# Patient Record
Sex: Male | Born: 1945 | Race: White | Hispanic: No | Marital: Married | Smoking: Former smoker
Health system: Southern US, Community
[De-identification: ages and names within clinical notes are randomized; demographics above are authoritative.]

## PROBLEM LIST (undated history)

## (undated) DIAGNOSIS — I10 Essential (primary) hypertension: Secondary | ICD-10-CM

## (undated) DIAGNOSIS — M1990 Unspecified osteoarthritis, unspecified site: Secondary | ICD-10-CM

## (undated) DIAGNOSIS — E039 Hypothyroidism, unspecified: Secondary | ICD-10-CM

## (undated) HISTORY — PX: CHOLECYSTECTOMY: SHX55

## (undated) HISTORY — PX: OTHER SURGICAL HISTORY: SHX169

## (undated) HISTORY — PX: LUMBAR FUSION: SHX111

## (undated) HISTORY — PX: TOTAL KNEE ARTHROPLASTY: SHX125

---

## 1998-03-10 ENCOUNTER — Ambulatory Visit (HOSPITAL_COMMUNITY): Admission: RE | Admit: 1998-03-10 | Discharge: 1998-03-10 | Payer: Self-pay | Admitting: Neurosurgery

## 1998-03-20 ENCOUNTER — Ambulatory Visit (HOSPITAL_COMMUNITY): Admission: RE | Admit: 1998-03-20 | Discharge: 1998-03-20 | Payer: Self-pay | Admitting: Neurosurgery

## 1998-04-10 ENCOUNTER — Inpatient Hospital Stay (HOSPITAL_COMMUNITY): Admission: RE | Admit: 1998-04-10 | Discharge: 1998-04-13 | Payer: Self-pay | Admitting: Neurosurgery

## 2007-10-03 ENCOUNTER — Inpatient Hospital Stay (HOSPITAL_COMMUNITY): Admission: RE | Admit: 2007-10-03 | Discharge: 2007-10-05 | Payer: Self-pay | Admitting: Orthopedic Surgery

## 2008-01-04 ENCOUNTER — Inpatient Hospital Stay (HOSPITAL_COMMUNITY): Admission: RE | Admit: 2008-01-04 | Discharge: 2008-01-06 | Payer: Self-pay | Admitting: Orthopedic Surgery

## 2009-08-06 IMAGING — CR DG KNEE 1-2V PORT*L*
2 series · 2 of 2 positions shown · non-contrast
Comparison: none

CLINICAL DATA: Postop left knee replacement.  
 PORTABLE LEFT KNEE - 2 VIEW:

[view not recorded (1 of 2)]
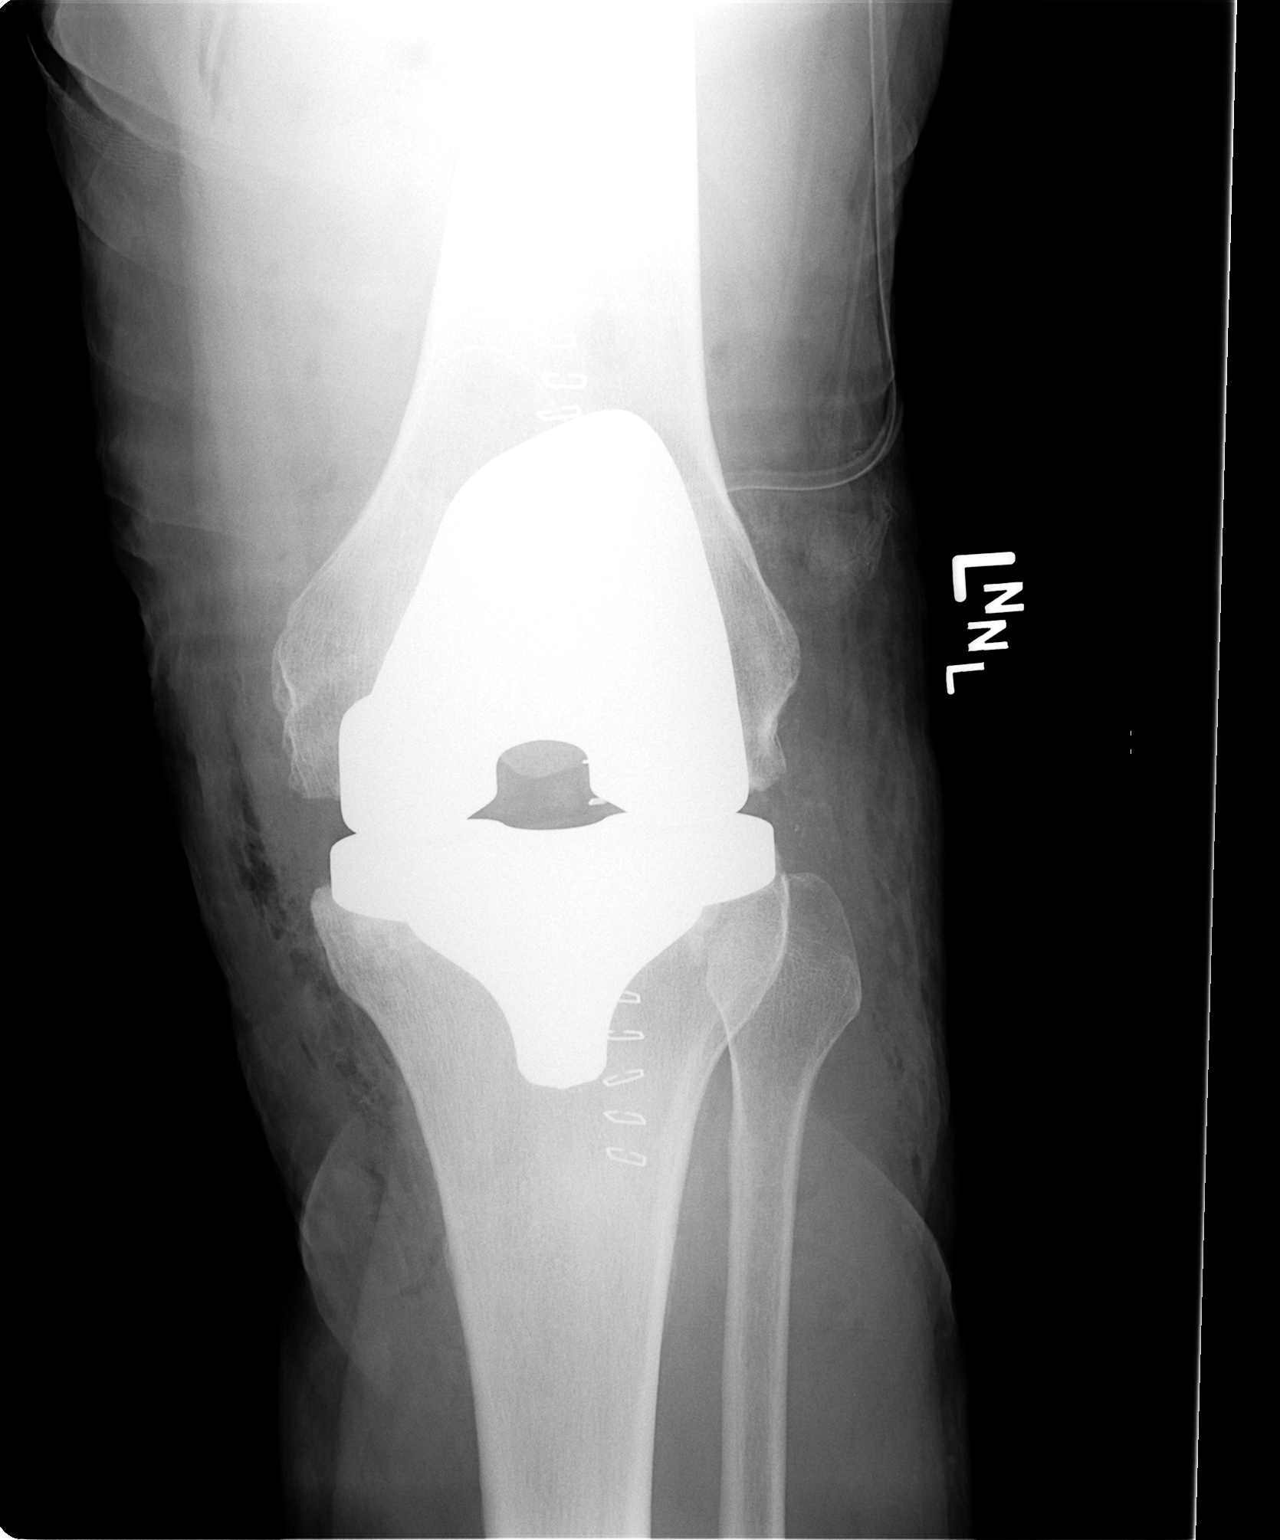

[view not recorded (2 of 2)]
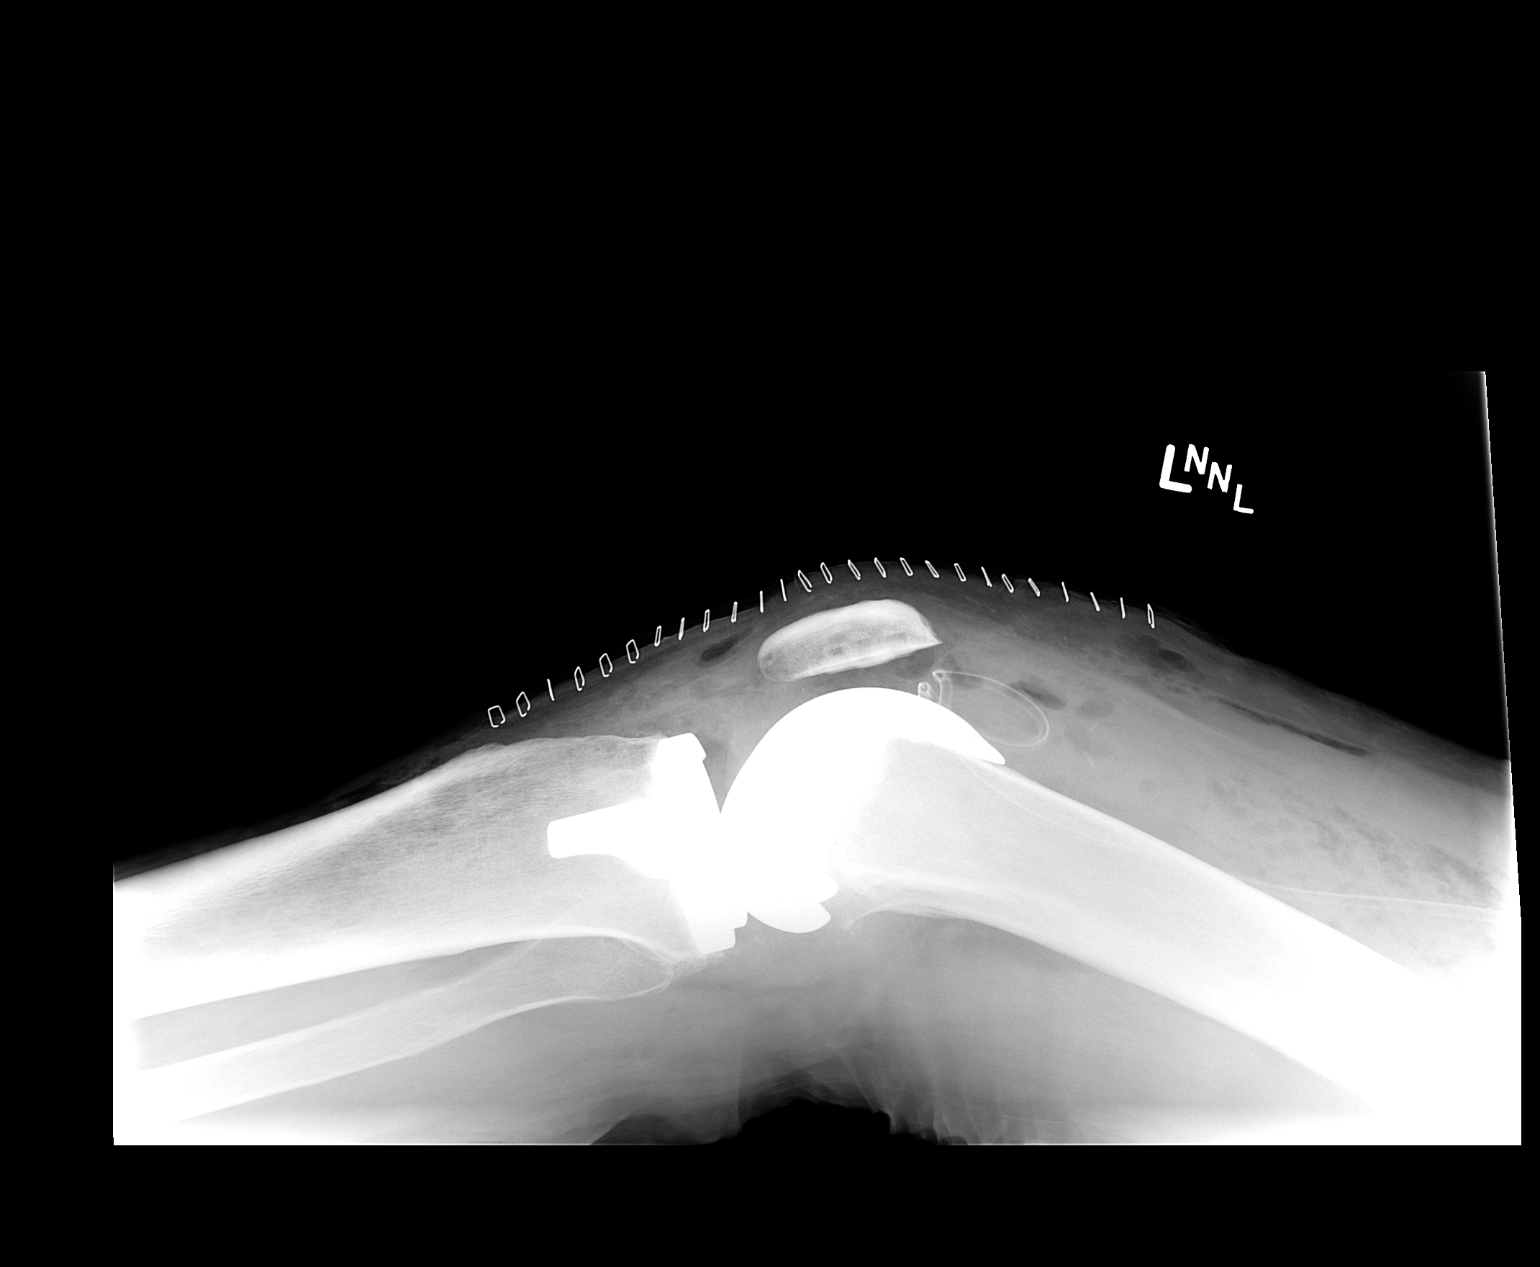

[2 of 2 positions shown; findings below may reference images not displayed]

FINDINGS: Left total knee arthroplasty.  Components appear well seated.  There is suprapatellar drain and skin staples.
IMPRESSION: Left total knee arthroplasty without evidence of complication.

## 2011-03-24 NOTE — Op Note (Signed)
NAME:  Jose Wade, Jose Wade NO.:  [id number]   MEDICAL RECORD NO.:  [id number]          PATIENT TYPE:  INP   LOCATION:  5025                         FACILITY:  [facility]   PHYSICIAN:  Loreta Ave, M.D. DATE OF BIRTH:  04-27-1946   DATE OF PROCEDURE:  01/04/2008  DATE OF DISCHARGE:                               OPERATIVE REPORT   PREOPERATIVE DIAGNOSES:  1. 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  2. 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   POSTOPERATIVE DIAGNOSES:  1. 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  2. 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   PROCEDURE:  1. Left knee total knee replacement Stryker triathlon prosthesis,      minimally invasive system.  Cemented #5 femoral component.      Cemented #6 tibial component with 9-mm polyethylene insert.      Resurfacing pegged medial offset cemented #38 patellar component.      Extensive medial capsular release for soft tissue balancing.  2. Exam manipulation of his right knee under anesthesia.   SURGEON:  Loreta Ave, M.D.   ASSISTANT:  Zonia Kief, P.A., present throughout the entire case and  necessary for timely completion of procedure.   ANESTHESIA:  General.   BLOOD LOSS:  Minimal.   SPECIMENS:  None.   CULTURES:  None.   COMPLICATIONS:  None.   DRESSING:  Soft compressive with knee immobilizer.   DRAIN:  Hemovac x1.   TOURNIQUET TIME:  1 hour 30 minutes.   PROCEDURE:  The patient brought to the operating room, placed on the  operating table in supine position.  After adequate anesthesia had been  obtained, the right knee was examined.  Just about full  extension/flexion to about 110 degrees.  This was gently manipulated,  breaking up suprapatellar adhesions, achieving full extension,  full  flexion and improved patellofemoral mobility.  He tolerated this well  without adverse occurrence.  When that was complete, attention was  turned to the opposite left knee.  Tourniquet applied.  Prepped and  draped in usual sterile fashion.  Exsanguinated with elevation of  Esmarch, tourniquet inflated to 350 mmHg.  Knee examined 7-10 degrees  flexion contracture, varus alignment, barely corrected in neutral.  Incision above the patella down to tibial tubercle.  Medial arthrotomy  going up to a vastus splitting incision preserving quad tendon for a  minimally invasive system.  Knee exposed.  Extensive exuberant  hypertrophic osteoarthritis.  Loose bodies, periarticular spurs,  remnants of menisci, cruciate ligaments excised.  Distal femur exposed.  Intramedullary guide placed.  Twelve mm.  Using epicondylar axis, the  femur was sized, cut and fitted for a pegged posterior stabilized #5  femoral component.  Trial fit well.  Attention was turned to the tibia.  Resection down just below the defect medially where he had erosion over  the posteromedial  aspect of the tibia.  Three degree posterior slope cut  with the extramedullary guide.  Size #6 component.  Trials put in place,  #6 on the tibia, #5 on the femur.  With a 9-mm polyethylene insert  trial, I had full extension, full flexion, nicely balanced knee after  his medial capsular release.  Tibia was marked for appropriate rotation  and then punched for the keel of the definitive component.  Patella was  exposed.  Periarticular spurs removed.  Posterior 11 mm resected.  Size  drilled and fitted for a 38-mm component.  With trials in place,  excellent patellofemoral tracking at completion.  All trials removed.  Copious irrigation with pulse irrigating device.  Cement prepared and  placed on all components which were firmly seated and the polyethylene  attached to tibia.  Knee reduced.  Once cement had hardened, knee was  reexamined.   Full extension, full flexion, nicely balanced knee with  good stability in flexion and extension and good patellofemoral  tracking.  Wound irrigated.  Hemovac placed and brought out through a  separate stab wound.  Arthrotomy closed with #1 Vicryl.  Skin and  subcutaneous tissue with Vicryl and staples.  Knee injected with  Marcaine.  Hemovac clamp.  Sterile compressive dressing applied.  Tourniquet deflated and removed.  Knee immobilizer applied.  Anesthesia  reversed.  Brought to the recovery room.  Tolerated the surgery well.  No complications.      Loreta Ave, M.D.  Electronically Signed     DFM/MEDQ  D:  01/04/2008  T:  01/05/2008  Job:  [id number]

## 2011-03-24 NOTE — Op Note (Signed)
NAME:  Jose, Wade NO.:  [id number]   MEDICAL RECORD NO.:  [id number]          PATIENT TYPE:  INP   LOCATION:  5040                         FACILITY:  [facility]   PHYSICIAN:  Loreta Ave, M.D. DATE OF BIRTH:  [birth year]   DATE OF PROCEDURE:  10/03/2007  DATE OF DISCHARGE:                               OPERATIVE REPORT   PREOPERATIVE DIAGNOSIS:  Endstage degenerative arthritis right knee,  varus alignment, flexion contracture.   POSTOPERATIVE DIAGNOSIS:  Endstage degenerative arthritis right knee,  varus alignment, flexion contracture.   PROCEDURE:  Right total knee replacement, Stryker triathlon prosthesis.  Soft tissue balancing.  Cemented pegged posterior stabilized #5 femoral  component.  Cemented #6 tibial component with 11 mm posterior stabilized  polyethylene insert.  Resurfacing cemented pegged 38 mm patellar  component.  Minimally invasive approach.   SURGEON:  Loreta Ave, M.D.   ASSISTANT:  Zonia Kief, PA present out the entire case.   ANESTHESIA:  General.   BLOOD LOSS:  Minimal.   TOURNIQUET TIME:  1 hour 30 minutes.   SPECIMENS:  None.   CULTURES:  None.   COMPLICATIONS:  None.   DRESSING:  Soft compressive, knee immobilizer.   DRAIN:  Hemovac x1.   PROCEDURE:  The patient was brought to the operating room and placed on  the operating table in the supine position.  After adequate anesthesia  had been obtained, right knee examined.  Varus alignment more than 5  degree flexion contracture further flexion better than 100 degrees.  Correctable to neutral.  Tourniquet applied.  Prepped and draped in the  usual sterile fashion.  Exsanguinated with elevation Esmarch tourniquet  inflated to 350 mmHg.  Straight incision above the patella down to  tibial tubercle.  Medial arthrotomy going up to the superomedial border  of patella and then splitting the vastus for minimally invasive approach  preserving quad tendon.  Knee exposed.   Grade 4 change throughout.  Medial capsule release.  Remnants of menisci, cruciate ligament,  numerous large loose bodies, periarticular spurs removed.  Distal femur  exposed.  Distal cut 12 mm set at 5 degrees of valgus.  Epicondylar axis  marked.  Cut, sized and fitted for a posterior stabilized #5 femoral  component.  This was a little narrow side-to-side, but it was a perfect  match front to back.  A 6 was too large front to back.  Attention turned  to the tibia.  Extramedullary guide.  Proximal cut 3 degrees posterior  slope cut going just below the medial defect.  Size #6 component.  Trial  put in place.  #5 on the femur, #6 on the tibia.  With the 11 mm insert  and a soft tissue balancing, full extension, full flexion, nicely  balanced knee with equal stability in flexion and extension.  Patella  was prepared, cut and fitted with a 38 mm cemented pegged component  medial offset.  Excellent patellofemoral tracking at completion.  All  trials removed.  Copious irrigation with a pulse irrigating device.  Cement prepared and placed on all components which were firmly  fitted.  Excessive cement removed.  Polyethylene attached to tibia and knee  reduced.  Once cement hardened, it was reexamined.  I was very pleased  with motion, stability and patellofemoral tracking.  Hemovac placed,  brought out through a separate stab wound.  Arthrotomy closed with #1  Vicryl, skin and subcutaneous tissue with Vicryl and staples.  Knee  injected with Marcaine.  Hemovac clamp.  Sterile compressive dressing  applied.  Tourniquet deflated and removed.  Knee immobilizer applied.  Anesthesia reversed.  Brought to the recovery room.  Tolerated surgery  well.  No complications.      Loreta Ave, M.D.  Electronically Signed     DFM/MEDQ  D:  10/04/2007  T:  10/04/2007  Job:  [id number]

## 2011-07-31 LAB — CBC
MCHC: 33.8
MCHC: 33.9
MCHC: 34
MCV: 85.6
Platelets: 158
Platelets: 232
RBC: 5.16
RDW: 15.1
RDW: 15.4
WBC: 5.4
WBC: 7.4
WBC: 8.2

## 2011-07-31 LAB — COMPREHENSIVE METABOLIC PANEL
Albumin: 4.3
Creatinine, Ser: 0.94
Glucose, Bld: 100 — ABNORMAL HIGH
Sodium: 141
Total Bilirubin: 0.9
Total Protein: 7.2

## 2011-07-31 LAB — URINALYSIS, ROUTINE W REFLEX MICROSCOPIC
Bilirubin Urine: NEGATIVE
Glucose, UA: NEGATIVE
Hgb urine dipstick: NEGATIVE
Ketones, ur: NEGATIVE
Specific Gravity, Urine: 1.008
pH: 6

## 2011-07-31 LAB — BASIC METABOLIC PANEL
BUN: 10
BUN: 18
CO2: 29
Calcium: 7.9 — ABNORMAL LOW
Chloride: 102
Creatinine, Ser: 1.04
GFR calc Af Amer: >60
GFR calc non Af Amer: >60
GFR calc non Af Amer: >60
Glucose, Bld: 143 — ABNORMAL HIGH

## 2011-07-31 LAB — APTT: aPTT: 34

## 2011-07-31 LAB — TYPE AND SCREEN: Antibody Screen: NEGATIVE

## 2011-07-31 LAB — PROTIME-INR
INR: 1.3
Prothrombin Time: 16.9 — ABNORMAL HIGH
Prothrombin Time: 21.4 — ABNORMAL HIGH

## 2011-08-18 LAB — PROTIME-INR
INR: 1
Prothrombin Time: 13.5

## 2011-08-18 LAB — BASIC METABOLIC PANEL
CO2: 26
CO2: 29
Calcium: 8.3 — ABNORMAL LOW
Chloride: 103
Creatinine, Ser: 1.08
Creatinine, Ser: 1.12
GFR calc Af Amer: >60
GFR calc Af Amer: >60
Potassium: 3.6
Sodium: 138

## 2011-08-18 LAB — COMPREHENSIVE METABOLIC PANEL
ALT: 22
AST: 27
Albumin: 4.1
Alkaline Phosphatase: 103
Glucose, Bld: 139 — ABNORMAL HIGH
Total Bilirubin: 1
Total Protein: 7.3

## 2011-08-18 LAB — URINALYSIS, ROUTINE W REFLEX MICROSCOPIC
Bilirubin Urine: NEGATIVE
Nitrite: NEGATIVE
Protein, ur: NEGATIVE
Urobilinogen, UA: 0.2
pH: 5

## 2011-08-18 LAB — CBC
HCT: 34.4 — ABNORMAL LOW
Hemoglobin: 11.8 — ABNORMAL LOW
Hemoglobin: 12 — ABNORMAL LOW
Hemoglobin: 15.2
MCHC: 34.3
MCHC: 34.5
MCHC: 34.8
MCV: 86.3
MCV: 86.7
MCV: 87.9
RBC: 3.89 — ABNORMAL LOW
RDW: 13.4
WBC: 6.6

## 2011-08-18 LAB — APTT: aPTT: 34

## 2011-08-18 LAB — TYPE AND SCREEN

## 2014-12-11 NOTE — Patient Instructions (Signed)
Your procedure is scheduled on: 12/17/2014  Report to [facility] at  800 AM.  Call this number if you have problems the morning of surgery: [phone]   Do not eat food or drink liquids :After Midnight.      Take these medicines the morning of surgery with A SIP OF WATER: lisinopril, metoprolol   Do not wear jewelry, make-up or nail polish.  Do not wear lotions, powders, or perfumes.   Do not shave 48 hours prior to surgery.  Do not bring valuables to the hospital.  Contacts, dentures or bridgework may not be worn into surgery.  Leave suitcase in the car. After surgery it may be brought to your room.  For patients admitted to the hospital, checkout time is 11:00 AM the day of discharge.   Patients discharged the day of surgery will not be allowed to drive home.  :     Please read over the following fact sheets that you were given: Coughing and Deep Breathing, Surgical Site Infection Prevention, Anesthesia Post-op Instructions and Care and Recovery After Surgery    Cataract A cataract is a clouding of the lens of the eye. When a lens becomes cloudy, vision is reduced based on the degree and nature of the clouding. Many cataracts reduce vision to some degree. Some cataracts make people more near-sighted as they develop. Other cataracts increase glare. Cataracts that are ignored and become worse can sometimes look white. The white color can be seen through the pupil. CAUSES   Aging. However, cataracts may occur at any age, even in newborns.   Certain drugs.   Trauma to the eye.   Certain diseases such as diabetes.   Specific eye diseases such as chronic inflammation inside the eye or a sudden attack of a rare form of glaucoma.   Inherited or acquired medical problems.  SYMPTOMS   Gradual, progressive drop in vision in the affected eye.   Severe, rapid visual loss. This most often happens when trauma is the cause.  DIAGNOSIS  To detect a cataract, an eye doctor examines the lens.  Cataracts are best diagnosed with an exam of the eyes with the pupils enlarged (dilated) by drops.  TREATMENT  For an early cataract, vision may improve by using different eyeglasses or stronger lighting. If that does not help your vision, surgery is the only effective treatment. A cataract needs to be surgically removed when vision loss interferes with your everyday activities, such as driving, reading, or watching TV. A cataract may also have to be removed if it prevents examination or treatment of another eye problem. Surgery removes the cloudy lens and usually replaces it with a substitute lens (intraocular lens, IOL).  At a time when both you and your doctor agree, the cataract will be surgically removed. If you have cataracts in both eyes, only one is usually removed at a time. This allows the operated eye to heal and be out of danger from any possible problems after surgery (such as infection or poor wound healing). In rare cases, a cataract may be doing damage to your eye. In these cases, your caregiver may advise surgical removal right away. The vast majority of people who have cataract surgery have better vision afterward. HOME CARE INSTRUCTIONS  If you are not planning surgery, you may be asked to do the following:  Use different eyeglasses.   Use stronger or brighter lighting.   Ask your eye doctor about reducing your medicine dose or changing medicines if  it is thought that a medicine caused your cataract. Changing medicines does not make the cataract go away on its own.   Become familiar with your surroundings. Poor vision can lead to injury. Avoid bumping into things on the affected side. You are at a higher risk for tripping or falling.   Exercise extreme care when driving or operating machinery.   Wear sunglasses if you are sensitive to bright light or experiencing problems with glare.  SEEK IMMEDIATE MEDICAL CARE IF:   You have a worsening or sudden vision loss.   You notice  redness, swelling, or increasing pain in the eye.   You have a fever.  Document Released: 10/26/2005 Document Revised: 10/15/2011 Document Reviewed: 06/19/2011 [facility] Patient Information 2012 [place].PATIENT INSTRUCTIONS POST-ANESTHESIA  IMMEDIATELY FOLLOWING SURGERY:  Do not drive or operate machinery for the first twenty four hours after surgery.  Do not make any important decisions for twenty four hours after surgery or while taking narcotic pain medications or sedatives.  If you develop intractable nausea and vomiting or a severe headache please notify your doctor immediately.  FOLLOW-UP:  Please make an appointment with your surgeon as instructed. You do not need to follow up with anesthesia unless specifically instructed to do so.  WOUND CARE INSTRUCTIONS (if applicable):  Keep a dry clean dressing on the anesthesia/puncture wound site if there is drainage.  Once the wound has quit draining you may leave it open to air.  Generally you should leave the bandage intact for twenty four hours unless there is drainage.  If the epidural site drains for more than 36-48 hours please call the anesthesia department.  QUESTIONS?:  Please feel free to call your physician or the hospital operator if you have any questions, and they will be happy to assist you.

## 2014-12-12 ENCOUNTER — Encounter (HOSPITAL_COMMUNITY)
Admission: RE | Admit: 2014-12-12 | Discharge: 2014-12-12 | Disposition: A | Discharge status: Home / Self Care | Payer: Medicare Other | Source: Ambulatory Visit | Attending: Ophthalmology | Admitting: Ophthalmology

## 2014-12-12 ENCOUNTER — Encounter (HOSPITAL_COMMUNITY): Payer: Self-pay

## 2014-12-12 DIAGNOSIS — H2511 Age-related nuclear cataract, right eye: Secondary | ICD-10-CM | POA: Diagnosis not present

## 2014-12-12 DIAGNOSIS — Z01818 Encounter for other preprocedural examination: Secondary | ICD-10-CM | POA: Insufficient documentation

## 2014-12-12 HISTORY — DX: Essential (primary) hypertension: I10

## 2014-12-12 HISTORY — DX: Unspecified osteoarthritis, unspecified site: M19.90

## 2014-12-12 LAB — CBC
HCT: 42.1 % (ref 39.0–52.0)
Hemoglobin: 14.2 g/dL (ref 13.0–17.0)
MCH: 29.8 pg (ref 26.0–34.0)
MCHC: 33.7 g/dL (ref 30.0–36.0)
MCV: 88.3 fL (ref 78.0–100.0)
Platelets: 183 10*3/uL (ref 150–400)
RBC: 4.77 MIL/uL (ref 4.22–5.81)
RDW: 13.1 % (ref 11.5–15.5)
WBC: 5.2 10*3/uL (ref 4.0–10.5)

## 2014-12-12 LAB — BASIC METABOLIC PANEL
ANION GAP: 3 — AB (ref 5–15)
BUN: 19 mg/dL (ref 6–23)
CALCIUM: 8.6 mg/dL (ref 8.4–10.5)
CO2: 28 mmol/L (ref 19–32)
Chloride: 107 mmol/L (ref 96–112)
Creatinine, Ser: 1 mg/dL (ref 0.50–1.35)
GFR calc Af Amer: 86 mL/min — ABNORMAL LOW (ref >90)
GFR, EST NON AFRICAN AMERICAN: 74 mL/min — AB (ref >90)
GLUCOSE: 144 mg/dL — AB (ref 70–99)
Potassium: 4.2 mmol/L (ref 3.5–5.1)
Sodium: 138 mmol/L (ref 135–145)

## 2014-12-12 NOTE — Pre-Procedure Instructions (Signed)
Patient given information top sign up for my chart at home. 

## 2014-12-14 MED ORDER — LIDOCAINE HCL 3.5 % OP GEL
OPHTHALMIC | Status: AC
  Filled 2014-12-14: qty 1

## 2014-12-14 MED ORDER — CYCLOPENTOLATE-PHENYLEPHRINE OP SOLN OPTIME - NO CHARGE
OPHTHALMIC | Status: AC
  Filled 2014-12-14: qty 2

## 2014-12-14 MED ORDER — LIDOCAINE HCL (PF) 1 % IJ SOLN
INTRAMUSCULAR | Status: AC
  Filled 2014-12-14: qty 2

## 2014-12-14 MED ORDER — TETRACAINE HCL 0.5 % OP SOLN
OPHTHALMIC | Status: AC
  Filled 2014-12-14: qty 2

## 2014-12-14 MED ORDER — NEOMYCIN-POLYMYXIN-DEXAMETH 3.5-10000-0.1 OP SUSP
OPHTHALMIC | Status: AC
  Filled 2014-12-14: qty 5

## 2014-12-14 MED ORDER — PHENYLEPHRINE HCL 2.5 % OP SOLN
OPHTHALMIC | Status: AC
  Filled 2014-12-14: qty 15

## 2014-12-17 ENCOUNTER — Ambulatory Visit (HOSPITAL_COMMUNITY): Payer: Medicare Other | Admitting: Anesthesiology

## 2014-12-17 ENCOUNTER — Encounter (HOSPITAL_COMMUNITY)
Admission: RE | Disposition: A | Discharge status: Home / Self Care | Payer: Self-pay | Source: Ambulatory Visit | Attending: Ophthalmology

## 2014-12-17 ENCOUNTER — Ambulatory Visit (HOSPITAL_COMMUNITY)
Admission: RE | Admit: 2014-12-17 | Discharge: 2014-12-17 | Disposition: A | Discharge status: Home / Self Care | Payer: Medicare Other | Source: Ambulatory Visit | Attending: Ophthalmology | Admitting: Ophthalmology

## 2014-12-17 ENCOUNTER — Encounter (HOSPITAL_COMMUNITY): Payer: Self-pay

## 2014-12-17 DIAGNOSIS — H25811 Combined forms of age-related cataract, right eye: Secondary | ICD-10-CM | POA: Diagnosis not present

## 2014-12-17 HISTORY — PX: CATARACT EXTRACTION W/PHACO: SHX586

## 2014-12-17 SURGERY — PHACOEMULSIFICATION, CATARACT, WITH IOL INSERTION
Anesthesia: Monitor Anesthesia Care | Site: Eye | Laterality: Right

## 2014-12-17 MED ORDER — TETRACAINE HCL 0.5 % OP SOLN
1.0000 [drp] | OPHTHALMIC | Status: AC
  Administered 2014-12-17 (×3): 1 [drp] via OPHTHALMIC

## 2014-12-17 MED ORDER — PROVISC 10 MG/ML IO SOLN
INTRAOCULAR | Status: DC | PRN
  Administered 2014-12-17: 0.85 mL via INTRAOCULAR

## 2014-12-17 MED ORDER — LACTATED RINGERS IV SOLN
INTRAVENOUS | Status: DC
Start: 2014-12-17 — End: 2014-12-17
  Administered 2014-12-17: 09:00:00 via INTRAVENOUS

## 2014-12-17 MED ORDER — PHENYLEPHRINE HCL 2.5 % OP SOLN
1.0000 [drp] | OPHTHALMIC | Status: AC
  Administered 2014-12-17 (×3): 1 [drp] via OPHTHALMIC

## 2014-12-17 MED ORDER — BSS IO SOLN
INTRAOCULAR | Status: DC | PRN
  Administered 2014-12-17: 15 mL

## 2014-12-17 MED ORDER — FENTANYL CITRATE 0.05 MG/ML IJ SOLN
25.0000 ug | INTRAMUSCULAR | Status: AC
  Administered 2014-12-17 (×2): 25 ug via INTRAVENOUS

## 2014-12-17 MED ORDER — MIDAZOLAM HCL 2 MG/2ML IJ SOLN
1.0000 mg | INTRAMUSCULAR | Status: DC | PRN
  Administered 2014-12-17: 2 mg via INTRAVENOUS

## 2014-12-17 MED ORDER — LIDOCAINE HCL 3.5 % OP GEL
1.0000 "application " | Freq: Once | OPHTHALMIC | Status: AC
  Administered 2014-12-17: 1 via OPHTHALMIC

## 2014-12-17 MED ORDER — POVIDONE-IODINE 5 % OP SOLN
OPHTHALMIC | Status: DC | PRN
  Administered 2014-12-17: 1 via OPHTHALMIC

## 2014-12-17 MED ORDER — NEOMYCIN-POLYMYXIN-DEXAMETH 3.5-10000-0.1 OP SUSP
OPHTHALMIC | Status: DC | PRN
  Administered 2014-12-17: 2 [drp] via OPHTHALMIC

## 2014-12-17 MED ORDER — LIDOCAINE HCL (PF) 1 % IJ SOLN
INTRAMUSCULAR | Status: DC | PRN
  Administered 2014-12-17: .7 mL

## 2014-12-17 MED ORDER — EPINEPHRINE HCL 1 MG/ML IJ SOLN
INTRAMUSCULAR | Status: AC
  Filled 2014-12-17: qty 1

## 2014-12-17 MED ORDER — MIDAZOLAM HCL 2 MG/2ML IJ SOLN
INTRAMUSCULAR | Status: AC
  Filled 2014-12-17: qty 2

## 2014-12-17 MED ORDER — FENTANYL CITRATE 0.05 MG/ML IJ SOLN
INTRAMUSCULAR | Status: AC
  Filled 2014-12-17: qty 2

## 2014-12-17 MED ORDER — BSS IO SOLN
INTRAOCULAR | Status: DC | PRN
  Administered 2014-12-17: 500 mL

## 2014-12-17 MED ORDER — CYCLOPENTOLATE-PHENYLEPHRINE 0.2-1 % OP SOLN
1.0000 [drp] | OPHTHALMIC | Status: AC
  Administered 2014-12-17 (×3): 1 [drp] via OPHTHALMIC

## 2014-12-17 SURGICAL SUPPLY — 11 items

## 2014-12-17 NOTE — Discharge Instructions (Signed)

## 2014-12-17 NOTE — Op Note (Signed)
Date of Admission: 12/17/2014  Date of Surgery: 12/17/2014   Pre-Op Dx: Cataract Right Eye  Post-Op Dx: Senile Combined Cataract Right  Eye,  Dx Code M57.846H25.811  Surgeon: Gemma PayorKerry Cacie Gaskins, M.D.  Assistants: None  Anesthesia: Topical with MAC  Indications: Painless, progressive loss of vision with compromise of daily activities.  Surgery: Cataract Extraction with Intraocular lens Implant Right Eye  Discription: The patient had dilating drops and viscous lidocaine placed into the Right eye in the pre-op holding area. After transfer to the operating room, a time out was performed. The patient was then prepped and draped. Beginning with a 75 degree blade a paracentesis port was made at the surgeon's 2 o'clock position. The anterior chamber was then filled with 1% non-preserved lidocaine. This was followed by filling the anterior chamber with Provisc.  A 2.224mm keratome blade was used to make a clear corneal incision at the temporal limbus.  A bent cystatome needle was used to create a continuous tear capsulotomy. Hydrodissection was performed with balanced salt solution on a Fine canula. The lens nucleus was then removed using the phacoemulsification handpiece. Residual cortex was removed with the I&A handpiece. The anterior chamber and capsular bag were refilled with Provisc. A posterior chamber intraocular lens was placed into the capsular bag with it's injector. The implant was positioned with the Kuglan hook. The Provisc was then removed from the anterior chamber and capsular bag with the I&A handpiece. Stromal hydration of the main incision and paracentesis port was performed with BSS on a Fine canula. The wounds were tested for leak which was negative. The patient tolerated the procedure well. There were no operative complications. The patient was then transferred to the recovery room in stable condition.  Complications: None  Specimen: None  EBL: None  Prosthetic device: Hoya iSert 250, power 20.0 D,  SN [id number].

## 2014-12-17 NOTE — Transfer of Care (Signed)
Immediate Anesthesia Transfer of Care Note  Patient: Jose DohenyJohn W Wade  Procedure(s) Performed: Procedure(s): CATARACT EXTRACTION PHACO AND INTRAOCULAR LENS PLACEMENT RIGHT EYE CDE=7.35 (Right)  Patient Location: Short Stay  Anesthesia Type:MAC  Level of Consciousness: awake, alert , oriented and patient cooperative  Airway & Oxygen Therapy: Patient Spontanous Breathing  Post-op Assessment: Report given to RN, Post -op Vital signs reviewed and stable and Patient moving all extremities  Post vital signs: Reviewed and stable  Last Vitals:  Filed Vitals:   12/17/14 0900  BP: 161/87  Pulse:   Temp:   Resp: 34    Complications: No apparent anesthesia complications

## 2014-12-17 NOTE — Anesthesia Postprocedure Evaluation (Signed)
  Anesthesia Post-op Note  Patient: Jose DohenyJohn W Ledin  Procedure(s) Performed: Procedure(s): CATARACT EXTRACTION PHACO AND INTRAOCULAR LENS PLACEMENT RIGHT EYE CDE=7.35 (Right)  Patient Location: Short Stay  Anesthesia Type:MAC  Level of Consciousness: awake, alert , oriented and patient cooperative  Airway and Oxygen Therapy: Patient Spontanous Breathing  Post-op Pain: none  Post-op Assessment: Post-op Vital signs reviewed, Patient's Cardiovascular Status Stable, Respiratory Function Stable, Patent Airway, No signs of Nausea or vomiting, Adequate PO intake and No headache  Post-op Vital Signs: Reviewed and stable  Last Vitals:  Filed Vitals:   12/17/14 0900  BP: 161/87  Pulse:   Temp:   Resp: 34    Complications: No apparent anesthesia complications

## 2014-12-17 NOTE — H&P (Signed)
I have reviewed the H&P, the patient was re-examined, and I have identified no interval changes in medical condition and plan of care since the history and physical of record  

## 2014-12-17 NOTE — Anesthesia Preprocedure Evaluation (Signed)
Anesthesia Evaluation  Patient identified by MRN, date of birth, ID band Patient awake    Reviewed: Allergy & Precautions, NPO status , Patient's Chart, lab work & pertinent test results, reviewed documented beta blocker date and time   Airway Mallampati: I  TM Distance: >3 FB     Dental  (+) Teeth Intact, Implants, Dental Advisory Given   Pulmonary neg pulmonary ROS, former smoker,  breath sounds clear to auscultation        Cardiovascular hypertension, Pt. on home beta blockers and Pt. on medications Rhythm:Regular Rate:Normal     Neuro/Psych    GI/Hepatic negative GI ROS,   Endo/Other    Renal/GU      Musculoskeletal  (+) Arthritis -,   Abdominal   Peds  Hematology   Anesthesia Other Findings   Reproductive/Obstetrics                             Anesthesia Physical Anesthesia Plan  ASA: II  Anesthesia Plan: MAC   Post-op Pain Management:    Induction: Intravenous  Airway Management Planned: Nasal Cannula  Additional Equipment:   Intra-op Plan:   Post-operative Plan:   Informed Consent: I have reviewed the patients History and Physical, chart, labs and discussed the procedure including the risks, benefits and alternatives for the proposed anesthesia with the patient or authorized representative who has indicated his/her understanding and acceptance.     Plan Discussed with:   Anesthesia Plan Comments:         Anesthesia Quick Evaluation

## 2014-12-18 ENCOUNTER — Encounter (HOSPITAL_COMMUNITY): Payer: Self-pay | Admitting: Ophthalmology

## 2016-12-23 ENCOUNTER — Encounter: Payer: Self-pay | Admitting: *Deleted

## 2016-12-24 ENCOUNTER — Encounter: Payer: Self-pay | Admitting: Cardiovascular Disease

## 2016-12-24 ENCOUNTER — Ambulatory Visit (INDEPENDENT_AMBULATORY_CARE_PROVIDER_SITE_OTHER): Payer: Medicare Other | Admitting: Cardiovascular Disease

## 2016-12-24 VITALS — BP 138/70 | HR 44 | Ht 72.0 in | Wt 229.2 lb

## 2016-12-24 DIAGNOSIS — R5383 Other fatigue: Secondary | ICD-10-CM | POA: Diagnosis not present

## 2016-12-24 DIAGNOSIS — I493 Ventricular premature depolarization: Secondary | ICD-10-CM | POA: Diagnosis not present

## 2016-12-24 DIAGNOSIS — R001 Bradycardia, unspecified: Secondary | ICD-10-CM | POA: Diagnosis not present

## 2016-12-24 NOTE — Patient Instructions (Signed)
Medication Instructions:  Continue all current medications.  Labwork: BMET, TSH, Mg - orders given today.    Testing/Procedures:  Your physician has requested that you have an echocardiogram. Echocardiography is a painless test that uses sound waves to create images of your heart. It provides your doctor with information about the size and shape of your heart and how well your heart's chambers and valves are working. This procedure takes approximately one hour. There are no restrictions for this procedure.  Your physician has recommended that you wear a 48 hour holter monitor. Holter monitors are medical devices that record the heart's electrical activity. Doctors most often use these monitors to diagnose arrhythmias. Arrhythmias are problems with the speed or rhythm of the heartbeat. The monitor is a small, portable device. You can wear one while you do your normal daily activities. This is usually used to diagnose what is causing palpitations/syncope (passing out).  Follow-Up: 3 weeks   Any Other Special Instructions Will Be Listed Below (If Applicable). Office will contact with results via phone or letter.    If you need a refill on your cardiac medications before your next appointment, please call your pharmacy.

## 2016-12-24 NOTE — Progress Notes (Signed)
CARDIOLOGY CONSULT NOTE  Patient ID: Jose Wade MRN: [id number] DOB/AGE: 01-14-1946 71 y.o.  Admit date: (Not on file) Primary Physician: Ignatius Specking, MD Referring Physician:   Reason for Consultation: Bradycardia  HPI: 71 year old male with a history of hypertension referred for the evaluation of bradycardia. He has been feeling fatigued for the past week. ECG performed at PCPs office recently showed normal sinus rhythm with frequent PVCs, heart rate 70 bpm. He said this occurred also about 1-1/2 years ago. He says his daughter also has "extra beats". He denies chest pain, shortness of breath, and syncope. He has occasional dizziness which is seldom.    No Known Allergies  Current Outpatient Prescriptions  Medication Sig Dispense Refill  . amLODipine (NORVASC) 5 MG tablet TAKE 1 TABLET BY MOUTH DAILY    . aspirin EC 81 MG tablet Take 81 mg by mouth daily.     . chlorthalidone (HYGROTON) 25 MG tablet Take 25 mg by mouth daily.     . Multiple Vitamin (MULTIVITAMIN WITH MINERALS) TABS tablet Take 1 tablet by mouth daily.    . potassium chloride (K-DUR) 10 MEQ tablet Take 10 mEq by mouth every other day.      No current facility-administered medications for this visit.     Past Medical History:  Diagnosis Date  . Arthritis   . Hypertension     Past Surgical History:  Procedure Laterality Date  . CATARACT EXTRACTION W/PHACO Right 12/17/2014   Procedure: CATARACT EXTRACTION PHACO AND INTRAOCULAR LENS PLACEMENT RIGHT EYE CDE=7.35;  Surgeon: Gemma Payor, MD;  Location: [facility];  Service: Ophthalmology;  Laterality: Right;  . CHOLECYSTECTOMY    . LUMBAR FUSION    . TOTAL KNEE ARTHROPLASTY Bilateral    2006    Social History   Social History  . Marital status: Married    Spouse name: N/A  . Number of children: N/A  . Years of education: N/A   Occupational History  . Not on file.   Social History Main Topics  . Smoking status: Former Smoker    Packs/day:  2.00    Years: 30.00    Types: Cigarettes    Quit date: 12/13/1975  . Smokeless tobacco: Never Used  . Alcohol use No  . Drug use: No  . Sexual activity: Yes    Birth control/ protection: None   Other Topics Concern  . Not on file   Social History Narrative  . No narrative on file     No family history of premature CAD in 1st degree relatives.  Prior to Admission medications   Medication Sig Start Date End Date Taking? Authorizing Provider  amLODipine (NORVASC) 5 MG tablet TAKE 1 TABLET BY MOUTH DAILY 10/07/15  Yes Historical Provider, MD  aspirin EC 81 MG tablet Take 81 mg by mouth daily.    Yes Historical Provider, MD  chlorthalidone (HYGROTON) 25 MG tablet Take 25 mg by mouth daily.    Yes Historical Provider, MD  Multiple Vitamin (MULTIVITAMIN WITH MINERALS) TABS tablet Take 1 tablet by mouth daily.   Yes Historical Provider, MD  potassium chloride (K-DUR) 10 MEQ tablet Take 10 mEq by mouth every other day.    Yes Historical Provider, MD     Review of systems complete and found to be negative unless listed above in HPI     Physical exam Blood pressure 138/70, pulse (!) 44, height 6' (1.829 m), weight 229 lb 3.2 oz (104 kg).  HR  by auscultation: 70 bpm  General: NAD Neck: No JVD, no thyromegaly or thyroid nodule.  Lungs: Clear to auscultation bilaterally with normal respiratory effort. CV: Nondisplaced PMI. Irregular rhythm with frequent premature contractions, normal S1/S2, no S3/S4, no murmur.  No peripheral edema.  No carotid bruit.  Abdomen: Soft, nontender, no hepatosplenomegaly, no distention.  Skin: Intact without lesions or rashes.  Neurologic: Alert and oriented x 3.  Psych: Normal affect. Extremities: No clubbing or cyanosis.  HEENT: Normal.   ECG: Most recent ECG reviewed.  Labs:   Lab Results  Component Value Date   WBC 5.2 12/12/2014   HGB 14.2 12/12/2014   HCT 42.1 12/12/2014   MCV 88.3 12/12/2014   PLT 183 12/12/2014   No results for  input(s): NA, K, CL, CO2, BUN, CREATININE, CALCIUM, PROT, BILITOT, ALKPHOS, ALT, AST, GLUCOSE in the last 168 hours.  Invalid input(s): LABALBU No results found for: CKTOTAL, CKMB, CKMBINDEX, TROPONINI No results found for: CHOL No results found for: HDL No results found for: LDLCALC No results found for: TRIG No results found for: CHOLHDL No results found for: LDLDIRECT       Studies: No results found.  ASSESSMENT AND PLAN:  1. Bradycardia/frequent PVC's: The heart rate monitor is likely not recording PVCs. Thus he is actually not bradycardic. By auscultation his heart rate is 70 bpm today. I will check a basic metabolic panel, TSH, and magnesium level to identify readily correctable causes. I will obtain a 48-hour Holter monitor. I will order a 2-D echocardiogram with Doppler to evaluate cardiac structure, function, and regional wall motion.  2. Fatigue:  I will check a basic metabolic panel, TSH, and magnesium level to identify readily correctable causes.    Dispo: fu 3 wks   Signed: Prentice DockerSuresh Koneswaran, M.D., F.A.C.C.  12/24/2016, 10:34 AM

## 2016-12-29 ENCOUNTER — Telehealth: Payer: Self-pay | Admitting: *Deleted

## 2016-12-29 NOTE — Telephone Encounter (Signed)
Notes Recorded by Lesle ChrisAngela G Hill, LPN on 1/61/09602/20/2018 at 10:00 AM EST Left message to return call.  ------  Notes Recorded by Laqueta LindenSuresh A Koneswaran, MD on 12/25/2016 at 4:42 PM EST TSH mildly elevated. Can be contributing to fatigue. Defer to PCP for management.

## 2016-12-30 NOTE — Telephone Encounter (Signed)
Notes Recorded by Lesle ChrisAngela G Abbie Berling, LPN on 1/61/09602/21/2018 at 10:31 AM EST Patient notified. Copy to pmd. He will call Dr. Sherril CroonVyas office today to schedule f/u OV in regards to this.

## 2017-01-05 ENCOUNTER — Ambulatory Visit (INDEPENDENT_AMBULATORY_CARE_PROVIDER_SITE_OTHER): Payer: Medicare Other

## 2017-01-05 ENCOUNTER — Other Ambulatory Visit: Payer: Self-pay | Admitting: *Deleted

## 2017-01-05 DIAGNOSIS — I493 Ventricular premature depolarization: Secondary | ICD-10-CM

## 2017-01-05 DIAGNOSIS — R5383 Other fatigue: Secondary | ICD-10-CM

## 2017-01-07 ENCOUNTER — Other Ambulatory Visit: Payer: Self-pay

## 2017-01-07 ENCOUNTER — Ambulatory Visit (INDEPENDENT_AMBULATORY_CARE_PROVIDER_SITE_OTHER): Payer: Medicare Other

## 2017-01-07 DIAGNOSIS — I493 Ventricular premature depolarization: Secondary | ICD-10-CM | POA: Diagnosis not present

## 2017-01-07 DIAGNOSIS — R5383 Other fatigue: Secondary | ICD-10-CM | POA: Diagnosis not present

## 2017-01-14 ENCOUNTER — Ambulatory Visit (INDEPENDENT_AMBULATORY_CARE_PROVIDER_SITE_OTHER): Payer: Medicare Other | Admitting: Cardiovascular Disease

## 2017-01-14 ENCOUNTER — Encounter: Payer: Self-pay | Admitting: Cardiovascular Disease

## 2017-01-14 VITALS — BP 138/68 | HR 38 | Ht 72.0 in | Wt 228.0 lb

## 2017-01-14 DIAGNOSIS — R5383 Other fatigue: Secondary | ICD-10-CM

## 2017-01-14 DIAGNOSIS — E039 Hypothyroidism, unspecified: Secondary | ICD-10-CM | POA: Diagnosis not present

## 2017-01-14 DIAGNOSIS — R001 Bradycardia, unspecified: Secondary | ICD-10-CM

## 2017-01-14 DIAGNOSIS — I493 Ventricular premature depolarization: Secondary | ICD-10-CM

## 2017-01-14 NOTE — Patient Instructions (Signed)
Medication Instructions:  Continue all current medications.  Labwork: none  Testing/Procedures: none  Follow-Up: As needed.    Any Other Special Instructions Will Be Listed Below (If Applicable).  If you need a refill on your cardiac medications before your next appointment, please call your pharmacy.  

## 2017-01-14 NOTE — Progress Notes (Signed)
SUBJECTIVE: The patient returns for follow-up after undergoing cardiovascular testing performed for the evaluation of bradycardia and fatigue.  Echocardiogram 01/07/17: Normal left ventricular systolic function and regional wall motion, LVEF 60-65%, grade 1 diastolic dysfunction.  Event monitoring demonstrated sinus rhythm with occasional PACs and frequent PVCs. There was no significant bradycardia.  TSH was mildly elevated at 5.86. He is now on levothyroxine 25 g daily and his symptoms have significantly improved.   Review of Systems: As per "subjective", otherwise negative.  No Known Allergies  Current Outpatient Prescriptions  Medication Sig Dispense Refill  . amLODipine (NORVASC) 5 MG tablet TAKE 1 TABLET BY MOUTH DAILY    . aspirin EC 81 MG tablet Take 81 mg by mouth daily.     . chlorthalidone (HYGROTON) 25 MG tablet Take 25 mg by mouth daily.     Marland Kitchen. levothyroxine (SYNTHROID, LEVOTHROID) 25 MCG tablet Take 25 mcg by mouth daily before breakfast.    . Multiple Vitamin (MULTIVITAMIN WITH MINERALS) TABS tablet Take 1 tablet by mouth daily.    . potassium chloride (K-DUR) 10 MEQ tablet Take 10 mEq by mouth every other day.      No current facility-administered medications for this visit.     Past Medical History:  Diagnosis Date  . Arthritis   . Hypertension     Past Surgical History:  Procedure Laterality Date  . CATARACT EXTRACTION W/PHACO Right 12/17/2014   Procedure: CATARACT EXTRACTION PHACO AND INTRAOCULAR LENS PLACEMENT RIGHT EYE CDE=7.35;  Surgeon: Gemma PayorKerry Hunt, MD;  Location: [facility];  Service: Ophthalmology;  Laterality: Right;  . CHOLECYSTECTOMY    . LUMBAR FUSION    . TOTAL KNEE ARTHROPLASTY Bilateral    2006    Social History   Social History  . Marital status: Married    Spouse name: N/A  . Number of children: N/A  . Years of education: N/A   Occupational History  . Not on file.   Social History Main Topics  . Smoking status: Former Smoker   Packs/day: 2.00    Years: 30.00    Types: Cigarettes    Quit date: 12/13/1975  . Smokeless tobacco: Never Used  . Alcohol use No  . Drug use: No  . Sexual activity: Yes    Birth control/ protection: None   Other Topics Concern  . Not on file   Social History Narrative  . No narrative on file     Vitals:   01/14/17 0949  BP: 138/68  Pulse: (!) 38  Weight: 228 lb (103.4 kg)  Height: 6' (1.829 m)    PHYSICAL EXAM General: NAD Neck: No JVD, no thyromegaly or thyroid nodule.  Lungs: Clear to auscultation bilaterally with normal respiratory effort. CV: Nondisplaced PMI. Irregular rhythm with frequent premature contractions, normal S1/S2, no S3/S4, no murmur.  No peripheral edema.   Abdomen: Soft, nontender, no distention.  Skin: Intact without lesions or rashes.  Neurologic: Alert and oriented x 3.  Psych: Normal affect. Extremities: No clubbing or cyanosis.  HEENT: Normal.     ECG: Most recent ECG reviewed.      ASSESSMENT AND PLAN: 1. Bradycardia/frequent PVC's: The heart rate monitor is likely not recording PVCs. Thus he is actually not bradycardic. Event monitoring demonstrated sinus rhythm with occasional PACs and frequent PVCs. There was no significant bradycardia. LVEF was normal.  2. Fatigue/hypothyroidism: Symptomatic improvement with levothyroxine 25 mcg daily. TSH mildly elevated at 5.86. No other significant electrolyte abnormalities.  Dispo: fu prn  Kate Sable, M.D., F.A.C.C.

## 2018-04-11 NOTE — Patient Instructions (Signed)
Philomena DohenyJohn W Poer  04/11/2018     @PREFPERIOPPHARMACY @   Your procedure is scheduled on 04/18/2018.  [facility].M.  Call this number if you have problems the morning of surgery:  [phone]   Remember:  No food or drink after midnight.      Take these medicines the morning of surgery with A SIP OF WATER Amlodipine, Synthroid    Do not wear jewelry, make-up or nail polish.  Do not wear lotions, powders, or perfumes, or deodorant.  Do not shave 48 hours prior to surgery.  Men may shave face and neck.  Do not bring valuables to the hospital.  [facility] any belongings or valuables.  Contacts, dentures or bridgework may not be worn into surgery.  Leave your suitcase in the car.  After surgery it may be brought to your room.  For patients admitted to the hospital, discharge time will be determined by your treatment team.  Patients discharged the day of surgery will not be allowed to drive home.    Please read over the following fact sheets that you were given. Anesthesia Post-op Instructions     PATIENT INSTRUCTIONS POST-ANESTHESIA  IMMEDIATELY FOLLOWING SURGERY:  Do not drive or operate machinery for the first twenty four hours after surgery.  Do not make any important decisions for twenty four hours after surgery or while taking narcotic pain medications or sedatives.  If you develop intractable nausea and vomiting or a severe headache please notify your doctor immediately.  FOLLOW-UP:  Please make an appointment with your surgeon as instructed. You do not need to follow up with anesthesia unless specifically instructed to do so.  WOUND CARE INSTRUCTIONS (if applicable):  Keep a dry clean dressing on the anesthesia/puncture wound site if there is drainage.  Once the wound has quit draining you may leave it open to air.  Generally you should leave the bandage intact for twenty four hours unless there is drainage.  If the epidural site  drains for more than 36-48 hours please call the anesthesia department.  QUESTIONS?:  Please feel free to call your physician or the hospital operator if you have any questions, and they will be happy to assist you.      Cataract Surgery Cataract surgery is a procedure to remove a cataract from your eye. A cataract is cloudiness on the lens of your eye. The lens focuses light inside the eye. When a lens becomes cloudy, your vision is affected. Cataract surgery is a procedure to remove the cloudy lens. A substitute lens (intraocular lens or IOL) is usually inserted as a replacement for the cloudy lens. Tell a health care provider about:  Any allergies you have.  All medicines you are taking, including vitamins, herbs, eye drops, creams, and over-the-counter medicines.  Any problems you or family members have had with anesthetic medicines.  Any blood disorders you have.  Any surgeries you have had, especially eye surgeries that include refractive surgery, such as PRK and LASIK.  Any medical conditions you have.  Whether you are pregnant or may be pregnant. What are the risks? Generally, this is a safe procedure. However, problems may occur, including:  Infection.  Bleeding.  Glaucoma.  Retinal detachment.  Allergic reactions to medicines.  Damage to other structures or organs.  Inflammation of the eye.  Clouding of the part of your eye that holds an IOL in place (after-cataract), if an IOL was inserted. This is  fairly common.  An IOL moving out of position, if an IOL was inserted. This is very rare.  Loss of vision. This is rare.  What happens before the procedure?  Follow instructions from your health care provider about eating or drinking restrictions.  Ask your health care provider about: ? Changing or stopping your regular medicines, including any eye drops you have been prescribed. This is especially important if you are taking diabetes medicines or blood  thinners. ? Taking medicines such as aspirin and ibuprofen. These medicines can thin your blood. Do not take these medicines before your procedure if your health care provider instructs you not to.  Do not put contact lenses in either eye on the day of your surgery.  Plan for someone to drive you to and from the procedure.  If you will be going home right after the procedure, plan to have someone with you for 24 hours. What happens during the procedure?  An IV tube may be inserted into one of your veins.  You will be given one or more of the following: ? A medicine to help you relax (sedative). ? A medicine to numb the area (local anesthetic). This may be numbing eye drops or an injection that is given behind the eye.  A small cut (incision) will be made to the edge of the clear, dome-shaped surface that covers the front of the eye (cornea).  A small probe will be inserted into the eye. This device gives off ultrasound waves that soften and break up the cloudy center of the lens. This makes it easier for the cloudy lens to be removed by suction.  An IOL may be implanted.  Part of the capsule that surrounds the lens will be left in the eye to support the IOL.  Your surgeon may use stitches (sutures) to close the incision. The procedure may vary among health care providers and hospitals. What happens after the procedure?  Your blood pressure, heart rate, breathing rate, and blood oxygen level will be monitored often until the medicines you were given have worn off.  You may be given a protective shield to wear over your eyes.  Do not drive for 24 hours if you received a sedative. This information is not intended to replace advice given to you by your health care provider. Make sure you discuss any questions you have with your health care provider. Document Released: 10/15/2011 Document Revised: 04/02/2016 Document Reviewed: 09/05/2015 Elsevier Interactive Patient Education  [employer].

## 2018-04-13 ENCOUNTER — Other Ambulatory Visit: Payer: Self-pay

## 2018-04-13 ENCOUNTER — Encounter (HOSPITAL_COMMUNITY)
Admission: RE | Admit: 2018-04-13 | Discharge: 2018-04-13 | Disposition: A | Discharge status: Home / Self Care | Payer: Medicare Other | Source: Ambulatory Visit | Attending: Ophthalmology | Admitting: Ophthalmology

## 2018-04-13 ENCOUNTER — Encounter (HOSPITAL_COMMUNITY): Payer: Self-pay

## 2018-04-13 DIAGNOSIS — Z01810 Encounter for preprocedural cardiovascular examination: Secondary | ICD-10-CM | POA: Insufficient documentation

## 2018-04-13 DIAGNOSIS — Z01812 Encounter for preprocedural laboratory examination: Secondary | ICD-10-CM | POA: Diagnosis present

## 2018-04-13 HISTORY — DX: Hypothyroidism, unspecified: E03.9

## 2018-04-13 LAB — CBC WITH DIFFERENTIAL/PLATELET
BASOS ABS: 0.1 10*3/uL (ref 0.0–0.1)
BASOS PCT: 1 %
Eosinophils Absolute: 0.4 10*3/uL (ref 0.0–0.7)
Eosinophils Relative: 5 %
HCT: 42.9 % (ref 39.0–52.0)
HEMOGLOBIN: 14.6 g/dL (ref 13.0–17.0)
Lymphocytes Relative: 29 %
Lymphs Abs: 2.2 10*3/uL (ref 0.7–4.0)
MCH: 30 pg (ref 26.0–34.0)
MCHC: 34 g/dL (ref 30.0–36.0)
MCV: 88.3 fL (ref 78.0–100.0)
Monocytes Absolute: 0.6 10*3/uL (ref 0.1–1.0)
Monocytes Relative: 8 %
NEUTROS PCT: 57 %
Neutro Abs: 4.3 10*3/uL (ref 1.7–7.7)
PLATELETS: 206 10*3/uL (ref 150–400)
RBC: 4.86 MIL/uL (ref 4.22–5.81)
RDW: 13.4 % (ref 11.5–15.5)
WBC: 7.5 10*3/uL (ref 4.0–10.5)

## 2018-04-13 LAB — BASIC METABOLIC PANEL
Anion gap: 8 (ref 5–15)
BUN: 20 mg/dL (ref 6–20)
CHLORIDE: 101 mmol/L (ref 101–111)
CO2: 30 mmol/L (ref 22–32)
Calcium: 9.3 mg/dL (ref 8.9–10.3)
Creatinine, Ser: 1.03 mg/dL (ref 0.61–1.24)
Glucose, Bld: 163 mg/dL — ABNORMAL HIGH (ref 65–99)
POTASSIUM: 3 mmol/L — AB (ref 3.5–5.1)
Sodium: 139 mmol/L (ref 135–145)

## 2018-04-13 NOTE — Pre-Procedure Instructions (Signed)
BMet results routed to PCP.

## 2018-04-18 ENCOUNTER — Encounter (HOSPITAL_COMMUNITY): Payer: Self-pay | Admitting: *Deleted

## 2018-04-18 ENCOUNTER — Ambulatory Visit (HOSPITAL_COMMUNITY): Payer: Medicare Other | Admitting: Anesthesiology

## 2018-04-18 ENCOUNTER — Ambulatory Visit (HOSPITAL_COMMUNITY)
Admission: RE | Admit: 2018-04-18 | Discharge: 2018-04-18 | Disposition: A | Discharge status: Home / Self Care | Payer: Medicare Other | Source: Ambulatory Visit | Attending: Ophthalmology | Admitting: Ophthalmology

## 2018-04-18 ENCOUNTER — Encounter (HOSPITAL_COMMUNITY)
Admission: RE | Disposition: A | Discharge status: Home / Self Care | Payer: Self-pay | Source: Ambulatory Visit | Attending: Ophthalmology

## 2018-04-18 DIAGNOSIS — H25812 Combined forms of age-related cataract, left eye: Secondary | ICD-10-CM | POA: Diagnosis present

## 2018-04-18 DIAGNOSIS — Z87891 Personal history of nicotine dependence: Secondary | ICD-10-CM | POA: Diagnosis not present

## 2018-04-18 DIAGNOSIS — I10 Essential (primary) hypertension: Secondary | ICD-10-CM | POA: Insufficient documentation

## 2018-04-18 HISTORY — PX: CATARACT EXTRACTION W/PHACO: SHX586

## 2018-04-18 SURGERY — PHACOEMULSIFICATION, CATARACT, WITH IOL INSERTION
Anesthesia: Monitor Anesthesia Care | Site: Eye | Laterality: Left

## 2018-04-18 MED ORDER — PROVISC 10 MG/ML IO SOLN
INTRAOCULAR | Status: DC | PRN
  Administered 2018-04-18: 0.85 mL via INTRAOCULAR

## 2018-04-18 MED ORDER — EPINEPHRINE PF 1 MG/ML IJ SOLN
INTRAOCULAR | Status: DC | PRN
  Administered 2018-04-18: 500 mL

## 2018-04-18 MED ORDER — LIDOCAINE HCL 3.5 % OP GEL
1.0000 "application " | Freq: Once | OPHTHALMIC | Status: AC
  Administered 2018-04-18: 1 via OPHTHALMIC

## 2018-04-18 MED ORDER — LIDOCAINE HCL (PF) 1 % IJ SOLN
INTRAMUSCULAR | Status: DC | PRN
  Administered 2018-04-18: .7 mL

## 2018-04-18 MED ORDER — FENTANYL CITRATE (PF) 100 MCG/2ML IJ SOLN
INTRAMUSCULAR | Status: AC
  Filled 2018-04-18: qty 2

## 2018-04-18 MED ORDER — PHENYLEPHRINE HCL 2.5 % OP SOLN
1.0000 [drp] | OPHTHALMIC | Status: AC
  Administered 2018-04-18 (×2): 1 [drp] via OPHTHALMIC

## 2018-04-18 MED ORDER — CYCLOPENTOLATE-PHENYLEPHRINE 0.2-1 % OP SOLN
1.0000 [drp] | OPHTHALMIC | Status: AC
  Administered 2018-04-18 (×2): 1 [drp] via OPHTHALMIC

## 2018-04-18 MED ORDER — BSS IO SOLN
INTRAOCULAR | Status: DC | PRN
  Administered 2018-04-18: 15 mL

## 2018-04-18 MED ORDER — FENTANYL CITRATE (PF) 100 MCG/2ML IJ SOLN
INTRAMUSCULAR | Status: DC | PRN
  Administered 2018-04-18: 25 ug via INTRAVENOUS

## 2018-04-18 MED ORDER — LIDOCAINE 3.5 % OP GEL OPTIME - NO CHARGE
OPHTHALMIC | Status: DC | PRN
  Administered 2018-04-18: 1 [drp] via OPHTHALMIC

## 2018-04-18 MED ORDER — TETRACAINE HCL 0.5 % OP SOLN
1.0000 [drp] | OPHTHALMIC | Status: AC
  Administered 2018-04-18 (×2): 1 [drp] via OPHTHALMIC

## 2018-04-18 MED ORDER — NEOMYCIN-POLYMYXIN-DEXAMETH 3.5-10000-0.1 OP SUSP
OPHTHALMIC | Status: DC | PRN
  Administered 2018-04-18: 2 [drp] via OPHTHALMIC

## 2018-04-18 MED ORDER — LACTATED RINGERS IV SOLN
INTRAVENOUS | Status: DC | PRN
  Administered 2018-04-18: 10:00:00 via INTRAVENOUS

## 2018-04-18 MED ORDER — POVIDONE-IODINE 5 % OP SOLN
OPHTHALMIC | Status: DC | PRN
  Administered 2018-04-18: 1 via OPHTHALMIC

## 2018-04-18 SURGICAL SUPPLY — 12 items
CLOTH BEACON ORANGE TIMEOUT ST (SAFETY) ×2 IMPLANT
EYE SHIELD UNIVERSAL CLEAR (GAUZE/BANDAGES/DRESSINGS) ×2 IMPLANT
GLOVE BIOGEL PI IND STRL 6.5 (GLOVE) IMPLANT
GLOVE BIOGEL PI IND STRL 7.0 (GLOVE) IMPLANT
GLOVE BIOGEL PI INDICATOR 6.5 (GLOVE) ×4
GLOVE BIOGEL PI INDICATOR 7.0 (GLOVE) ×2
LENS ALC ACRYL/TECN (Ophthalmic Related) ×2 IMPLANT
PAD ARMBOARD 7.5X6 YLW CONV (MISCELLANEOUS) ×2 IMPLANT
SYRINGE LUER LOK 1CC (MISCELLANEOUS) ×2 IMPLANT
TAPE SURG TRANSPORE 1 IN (GAUZE/BANDAGES/DRESSINGS) IMPLANT
TAPE SURGICAL TRANSPORE 1 IN (GAUZE/BANDAGES/DRESSINGS) ×2
WATER STERILE IRR 250ML POUR (IV SOLUTION) ×2 IMPLANT

## 2018-04-18 NOTE — Discharge Instructions (Signed)

## 2018-04-18 NOTE — Anesthesia Procedure Notes (Signed)
Procedure Name: MAC Date/Time: 04/18/2018 11:16 AM Performed by: Vista Deck, CRNA Pre-anesthesia Checklist: Patient identified, Emergency Drugs available, Suction available, Timeout performed and Patient being monitored Patient Re-evaluated:Patient Re-evaluated prior to induction Oxygen Delivery Method: Nasal Cannula

## 2018-04-18 NOTE — Anesthesia Preprocedure Evaluation (Signed)
Anesthesia Evaluation  Patient identified by MRN, date of birth, ID band Patient awake    Reviewed: Allergy & Precautions, H&P , NPO status , Patient's Chart, lab work & pertinent test results, reviewed documented beta blocker date and time   Airway Mallampati: I  TM Distance: >3 FB Neck ROM: full    Dental no notable dental hx. (+) Teeth Intact, Caps, Dental Advidsory Given   Pulmonary neg pulmonary ROS, former smoker,    Pulmonary exam normal breath sounds clear to auscultation       Cardiovascular Exercise Tolerance: Good hypertension, On Medications negative cardio ROS   Rhythm:regular Rate:Normal     Neuro/Psych negative neurological ROS  negative psych ROS   GI/Hepatic negative GI ROS, Neg liver ROS,   Endo/Other  negative endocrine ROSHypothyroidism   Renal/GU negative Renal ROS  negative genitourinary   Musculoskeletal   Abdominal   Peds  Hematology negative hematology ROS (+)   Anesthesia Other Findings Treated for HTN for 3-4 yrs, well controlled, denies h/o angina, CP, MI Treated for hypothyroid 4-5 years, considered stable Chronic cough- does not seem to be positional  Reproductive/Obstetrics negative OB ROS                             Anesthesia Physical Anesthesia Plan  ASA: III  Anesthesia Plan: MAC   Post-op Pain Management:    Induction:   PONV Risk Score and Plan:   Airway Management Planned:   Additional Equipment:   Intra-op Plan:   Post-operative Plan:   Informed Consent: I have reviewed the patients History and Physical, chart, labs and discussed the procedure including the risks, benefits and alternatives for the proposed anesthesia with the patient or authorized representative who has indicated his/her understanding and acceptance.   Dental Advisory Given  Plan Discussed with: CRNA and Anesthesiologist  Anesthesia Plan Comments:          Anesthesia Quick Evaluation

## 2018-04-18 NOTE — Anesthesia Postprocedure Evaluation (Signed)
Anesthesia Post Note  Patient: Jose Wade  Procedure(s) Performed: CATARACT EXTRACTION PHACO AND INTRAOCULAR LENS PLACEMENT LEFT EYE (Left Eye)  Patient location during evaluation: Short Stay Anesthesia Type: MAC Level of consciousness: awake and alert and patient cooperative Pain management: satisfactory to patient Vital Signs Assessment: post-procedure vital signs reviewed and stable Respiratory status: spontaneous breathing Cardiovascular status: stable Postop Assessment: no apparent nausea or vomiting Anesthetic complications: no     Last Vitals:  Vitals:   04/18/18 0937 04/18/18 1141  BP: (!) 146/77 139/81  Pulse:  71  Resp: (!) 22 16  Temp: 36.9 C 36.7 C  SpO2: 97% 100%    Last Pain:  Vitals:   04/18/18 1141  TempSrc: Oral  PainSc: 0-No pain                 Ryle Buscemi

## 2018-04-18 NOTE — H&P (Signed)
I have reviewed the H&P, the patient was re-examined, and I have identified no interval changes in medical condition and plan of care since the history and physical of record  

## 2018-04-18 NOTE — Op Note (Signed)
Date of Admission: 04/18/2018  Date of Surgery: 04/18/2018  Pre-Op Dx: Cataract Left  Eye  Post-Op Dx: Senile Combined Cataract  Left  Eye,  Dx Code X90.240  Surgeon: Tonny Branch, M.D.  Assistants: None  Anesthesia: Topical with MAC  Indications: Painless, progressive loss of vision with compromise of daily activities.  Surgery: Cataract Extraction with Intraocular lens Implant Left Eye  Discription: The patient had dilating drops and viscous lidocaine placed into the Left eye in the pre-op holding area. After transfer to the operating room, a time out was performed. The patient was then prepped and draped. Beginning with a 44m blade a paracentesis port was made at the surgeon's 2 o'clock position. The anterior chamber was then filled with 1% non-preserved lidocaine. This was followed by filling the anterior chamber with Provisc.  A 2.471mkeratome blade was used to make a clear corneal incision at the temporal limbus.  A bent cystatome needle was used to create a continuous tear capsulotomy. Hydrodissection was performed with balanced salt solution on a Fine canula. The lens nucleus was then removed using the phacoemulsification handpiece. Residual cortex was removed with the I&A handpiece. The anterior chamber and capsular bag were refilled with Provisc. A posterior chamber intraocular lens was placed into the capsular bag with it's injector. The implant was positioned with the Kuglan hook. The Provisc was then removed from the anterior chamber and capsular bag with the I&A handpiece. Stromal hydration of the main incision and paracentesis port was performed with BSS on a Fine canula. The wounds were tested for leak which was negative. The patient tolerated the procedure well. There were no operative complications. The patient was then transferred to the recovery room in stable condition.  Complications: None  Specimen: None  EBL: None  Prosthetic device: J[employer]J Technis, PCB00, power 22.0, SN  [id number]

## 2018-04-18 NOTE — Transfer of Care (Signed)
Immediate Anesthesia Transfer of Care Note  Patient: Jose Wade  Procedure(s) Performed: CATARACT EXTRACTION PHACO AND INTRAOCULAR LENS PLACEMENT LEFT EYE (Left Eye)  Patient Location: Short Stay  Anesthesia Type:MAC  Level of Consciousness: awake, alert  and patient cooperative  Airway & Oxygen Therapy: Patient Spontanous Breathing  Post-op Assessment: Report given to RN and Post -op Vital signs reviewed and stable  Post vital signs: Reviewed and stable  Last Vitals:  Vitals Value Taken Time  BP    Temp    Pulse    Resp    SpO2      Last Pain:  Vitals:   04/18/18 0937  TempSrc: Oral  PainSc: 0-No pain      Patients Stated Pain Goal: 8 (69/62/95 2841)  Complications: No apparent anesthesia complications

## 2018-04-19 ENCOUNTER — Encounter (HOSPITAL_COMMUNITY): Payer: Self-pay | Admitting: Ophthalmology

## 2018-12-15 ENCOUNTER — Ambulatory Visit (INDEPENDENT_AMBULATORY_CARE_PROVIDER_SITE_OTHER): Payer: Medicare Other | Admitting: Otolaryngology

## 2018-12-15 DIAGNOSIS — K219 Gastro-esophageal reflux disease without esophagitis: Secondary | ICD-10-CM

## 2018-12-15 DIAGNOSIS — R490 Dysphonia: Secondary | ICD-10-CM
